# Patient Record
Sex: Female | Born: 1990 | Race: Black or African American | Hispanic: No | Marital: Married | Smoking: Former smoker
Health system: Southern US, Community
[De-identification: ages and names within clinical notes are randomized; demographics above are authoritative.]

## PROBLEM LIST (undated history)

## (undated) DIAGNOSIS — D649 Anemia, unspecified: Secondary | ICD-10-CM

## (undated) DIAGNOSIS — E282 Polycystic ovarian syndrome: Secondary | ICD-10-CM

## (undated) DIAGNOSIS — R7303 Prediabetes: Secondary | ICD-10-CM

---

## 2011-10-06 ENCOUNTER — Encounter: Payer: Self-pay | Admitting: *Deleted

## 2011-10-06 ENCOUNTER — Emergency Department (INDEPENDENT_AMBULATORY_CARE_PROVIDER_SITE_OTHER)
Admission: EM | Admit: 2011-10-06 | Discharge: 2011-10-06 | Disposition: A | Discharge status: Home / Self Care | Payer: Self-pay | Source: Home / Self Care | Attending: Emergency Medicine | Admitting: Emergency Medicine

## 2011-10-06 DIAGNOSIS — R6889 Other general symptoms and signs: Secondary | ICD-10-CM

## 2011-10-06 HISTORY — DX: Anemia, unspecified: D64.9

## 2011-10-06 MED ORDER — ALBUTEROL SULFATE HFA 108 (90 BASE) MCG/ACT IN AERS: 1.0000 | INHALATION_SPRAY | Freq: Four times a day (QID) | RESPIRATORY_TRACT | Status: DC | PRN

## 2011-10-06 MED ORDER — GUAIFENESIN-CODEINE 100-10 MG/5ML PO SYRP: 5.0000 mL | ORAL_SOLUTION | Freq: Four times a day (QID) | ORAL | Status: AC | PRN

## 2011-10-06 MED ORDER — IBUPROFEN 600 MG PO TABS: 600.0000 mg | ORAL_TABLET | Freq: Four times a day (QID) | ORAL | Status: AC | PRN

## 2011-10-06 NOTE — ED Provider Notes (Signed)
History     CSN: [id number] Arrival date & time: 10/06/2011  9:09 AM   First MD Initiated Contact with Patient 10/06/11 0957      Chief Complaint  Patient presents with  . Cough    HPI Comments: HPI : Flu symptoms for about 2 days. Fever to 101 with chills, sweats, myalgias, fatigue, headache, nonproductive cough, rhinorrhea, irritated throat. Symptoms are progressively worsening, despite trying OTC fever reducing medicine and rest and fluids. Has decreased appetite, but tolerating some liquids by mouth. No flu shot this year. 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   Review of Systems: Positive for fatigue, mild nasal congestion, mild sore throat, mild swollen anterior neck glands, mild cough. Negative for acute vision changes, stiff neck, focal weakness, syncope, seizures, respiratory distress, vomiting, diarrhea, GU symptoms.   Patient is a 20 y.o. female presenting with cough. The history is provided by the patient.  Cough The cough is non-productive. The maximum temperature recorded prior to her arrival was 101 to 101.9 F. She is not a smoker. Her past medical history is significant for asthma.    Past Medical History  Diagnosis Date  . Asthma   . Anemia     History reviewed. No pertinent past surgical history.  Family History  Problem Relation Age of Onset  . Hypertension Mother     History  Substance Use Topics  . Smoking status: Never Smoker   . Smokeless tobacco: Not on file  . Alcohol Use: No    OB History    Grav Para Term Preterm Abortions TAB SAB Ect Mult Living                  Review of Systems  Allergies  Review of patient's allergies indicates no known allergies.  Home Medications   Current Outpatient Rx  Name Route Sig Dispense Refill  . ALBUTEROL SULFATE HFA 108 (90 BASE) MCG/ACT IN AERS Inhalation Inhale 1-2 puffs into the lungs every 6 (six) hours as needed for wheezing. 1 Inhaler 0  . GUAIFENESIN-CODEINE 100-10 MG/5ML PO  SYRP Oral Take 5 mLs by mouth 4 (four) times daily as needed for cough. 120 mL 0  . IBUPROFEN 600 MG PO TABS Oral Take 1 tablet (600 mg total) by mouth every 6 (six) hours as needed for pain. 30 tablet 0    BP 114/68  Pulse 92  Temp(Src) 99.9 F (37.7 C) (Oral)  Resp 20  SpO2 100%  LMP 10/05/2011  Physical Exam  Nursing note and vitals reviewed. Constitutional: She is oriented to person, place, and time. She appears well-developed and well-nourished.       Appears uncomfortable  HENT:  Head: Normocephalic and atraumatic.  Right Ear: Tympanic membrane normal.  Left Ear: Tympanic membrane normal.  Nose: Mucosal edema and rhinorrhea present. Right sinus exhibits no maxillary sinus tenderness and no frontal sinus tenderness. Left sinus exhibits no maxillary sinus tenderness and no frontal sinus tenderness.  Mouth/Throat: Uvula is midline and mucous membranes are normal. Posterior oropharyngeal erythema present. No oropharyngeal exudate.       (-) frontal, maxillary sinus tenderness  Eyes: Conjunctivae and EOM are normal. Pupils are equal, round, and reactive to light.  Neck: Normal range of motion. Neck supple.  Cardiovascular: Normal rate, regular rhythm, normal heart sounds and intact distal pulses.   Pulmonary/Chest: Effort normal and breath sounds normal. No respiratory distress. She has no wheezes. She has no rales.  Abdominal: Soft. She exhibits no distension. There  is no tenderness. There is no rebound and no guarding.  Musculoskeletal: Normal range of motion.  Lymphadenopathy:    She has cervical adenopathy.  Neurological: She is alert and oriented to person, place, and time.  Skin: Skin is warm and dry. No rash noted.  Psychiatric: She has a normal mood and affect. Her behavior is normal. Judgment and thought content normal.    ED Course  Procedures (including critical care time)  Labs Reviewed - No data to display No results found.   1. Flu-like symptoms     MDM    Pt appears to be in NAD. VSS. Pt non-toxic appearing. No evidence of pharyngitis or OM. No evidence of neck stiffness or other sx to support meningitis. No evidence of dehydration. Abd S/NT/ND without peritoneal sx. Doubt intraabdominal process. No evidence of PNA or UTI. Pt able to tolerate PO. Pt with viral syndrome, probable flu. Will start bronchodilators as pt with h/o asthma and is at higher risk for PNA. Will treat symptomatically and have pt f/u with PCP of choice PRN   Luiz Blare, MD 10/06/11 1101

## 2011-10-06 NOTE — ED Notes (Signed)
Pt  Has  Symptoms  Of  Cough  /  Congestion        Runny     Nose      Body  Aches     Nasal  stuffyness   Symptoms  X  2  Days    Appears  In no  Severe  Distress   Speaking in  Complete  sentances

## 2012-03-16 ENCOUNTER — Emergency Department (HOSPITAL_COMMUNITY)
Admission: EM | Admit: 2012-03-16 | Discharge: 2012-03-16 | Disposition: A | Discharge status: Home / Self Care | Payer: Self-pay | Attending: Emergency Medicine | Admitting: Emergency Medicine

## 2012-03-16 ENCOUNTER — Encounter (HOSPITAL_COMMUNITY): Payer: Self-pay | Admitting: Emergency Medicine

## 2012-03-16 DIAGNOSIS — R42 Dizziness and giddiness: Secondary | ICD-10-CM | POA: Insufficient documentation

## 2012-03-16 DIAGNOSIS — R0602 Shortness of breath: Secondary | ICD-10-CM | POA: Insufficient documentation

## 2012-03-16 DIAGNOSIS — R109 Unspecified abdominal pain: Secondary | ICD-10-CM | POA: Insufficient documentation

## 2012-03-16 DIAGNOSIS — R112 Nausea with vomiting, unspecified: Secondary | ICD-10-CM | POA: Insufficient documentation

## 2012-03-16 DIAGNOSIS — K439 Ventral hernia without obstruction or gangrene: Secondary | ICD-10-CM | POA: Insufficient documentation

## 2012-03-16 DIAGNOSIS — J45909 Unspecified asthma, uncomplicated: Secondary | ICD-10-CM | POA: Insufficient documentation

## 2012-03-16 MED ORDER — SODIUM CHLORIDE 0.9 % IV BOLUS (SEPSIS)
1000.0000 mL | Freq: Once | INTRAVENOUS | Status: AC
  Administered 2012-03-16: 1000 mL via INTRAVENOUS

## 2012-03-16 MED ORDER — ONDANSETRON HCL 4 MG PO TABS: 4.0000 mg | ORAL_TABLET | Freq: Four times a day (QID) | ORAL | Status: AC

## 2012-03-16 MED ORDER — ONDANSETRON HCL 4 MG/2ML IJ SOLN
4.0000 mg | Freq: Once | INTRAMUSCULAR | Status: AC
  Administered 2012-03-16: 4 mg via INTRAVENOUS
  Filled 2012-03-16: qty 2

## 2012-03-16 NOTE — Discharge Instructions (Signed)
B.R.A.T. Diet Your doctor has recommended the B.R.A.T. diet for you or your child until the condition improves. This is often used to help control diarrhea and vomiting symptoms. If you or your child can tolerate clear liquids, you may have:  Bananas.   Rice.   Applesauce.   Toast (and other simple starches such as crackers, potatoes, noodles).  Be sure to avoid dairy products, meats, and fatty foods until symptoms are better. Fruit juices such as apple, grape, and prune juice can make diarrhea worse. Avoid these. Continue this diet for 2 days or as instructed by your caregiver. Document Released: 10/04/2005 Document Revised: 09/23/2011 Document Reviewed: 03/23/2007 ExitCare Patient Information 2012 [employer]. 

## 2012-03-16 NOTE — ED Provider Notes (Signed)
History     CSN: [id number]  Arrival date & time 03/16/12  0354   First MD Initiated Contact with Patient 03/16/12 0411      Chief Complaint  Patient presents with  . Abdominal Pain  . Nausea    (Consider location/radiation/quality/duration/timing/severity/associated sxs/prior treatment) HPI Comments: Patient was awakened suddenly.  Several hours ago with nausea and vomiting.  No diarrhea.  She states this has aggravated her ventral hernia that is now sore  .  She states she has not vomited in about one hour, but is concerned about her abdominal pain  Patient is a 21 y.o. female presenting with abdominal pain. The history is provided by the patient.  Abdominal Pain The primary symptoms of the illness include abdominal pain, shortness of breath, nausea and vomiting. The primary symptoms of the illness do not include fever, fatigue, diarrhea, dysuria or vaginal discharge. The onset of the illness was sudden.  The abdominal pain began 1 to 2 hours ago. The pain came on gradually. The abdominal pain has been unchanged since its onset. The abdominal pain does not radiate. The severity of the abdominal pain is 2/10. The abdominal pain is exacerbated by vomiting.  Symptoms associated with the illness do not include constipation.    Past Medical History  Diagnosis Date  . Asthma   . Anemia     History reviewed. No pertinent past surgical history.  Family History  Problem Relation Age of Onset  . Hypertension Mother     History  Substance Use Topics  . Smoking status: Never Smoker   . Smokeless tobacco: Not on file  . Alcohol Use: No    OB History    Grav Para Term Preterm Abortions TAB SAB Ect Mult Living                  Review of Systems  Constitutional: Negative for fever and fatigue.  Respiratory: Positive for shortness of breath.   Gastrointestinal: Positive for nausea, vomiting and abdominal pain. Negative for diarrhea, constipation and abdominal distention.    Genitourinary: Negative for dysuria and vaginal discharge.  Neurological: Positive for dizziness. Negative for weakness and headaches.    Allergies  Sulfa antibiotics  Home Medications   Current Outpatient Rx  Name Route Sig Dispense Refill  . ALBUTEROL SULFATE HFA 108 (90 BASE) MCG/ACT IN AERS Inhalation Inhale 1-2 puffs into the lungs every 6 (six) hours as needed for wheezing. 1 Inhaler 0  . ONDANSETRON HCL 4 MG PO TABS Oral Take 1 tablet (4 mg total) by mouth every 6 (six) hours. 12 tablet 0    BP 109/58  Pulse 67  Temp(Src) 98.4 F (36.9 C) (Oral)  Resp 20  SpO2 100%  LMP 02/28/2012  Physical Exam  Constitutional: She is oriented to person, place, and time. She appears well-developed and well-nourished.  HENT:  Head: Normocephalic.  Eyes: Pupils are equal, round, and reactive to light.  Neck: Normal range of motion.  Cardiovascular: Normal rate.   Pulmonary/Chest: Effort normal.  Abdominal: Soft. She exhibits no distension. A hernia is present. Hernia confirmed positive in the ventral area.    Musculoskeletal: Normal range of motion.  Neurological: She is alert and oriented to person, place, and time.  Skin: Skin is warm.    ED Course  Procedures (including critical care time)  Labs Reviewed - No data to display No results found.   1. Nausea and vomiting       MDM  Arman Filter, NP 03/16/12 [phone]

## 2012-03-16 NOTE — ED Notes (Signed)
Pt states she has a hernia and she woke up tonight with pain  Pt states she has been nauseated and has vomited x 1 and since the pain has been worse

## 2012-03-16 NOTE — ED Provider Notes (Signed)
Medical screening examination/treatment/procedure(s) were performed by non-physician practitioner and as supervising physician I was immediately available for consultation/collaboration.   Lyanne Co, MD 03/16/12 [phone]

## 2014-01-29 ENCOUNTER — Ambulatory Visit: Payer: Self-pay | Admitting: Advanced Practice Midwife

## 2014-04-23 ENCOUNTER — Ambulatory Visit: Payer: Self-pay | Admitting: Internal Medicine

## 2015-11-06 ENCOUNTER — Emergency Department (HOSPITAL_COMMUNITY)
Admission: EM | Admit: 2015-11-06 | Discharge: 2015-11-06 | Discharge status: Against Medical Advice | Payer: BLUE CROSS/BLUE SHIELD | Attending: Emergency Medicine | Admitting: Emergency Medicine

## 2015-11-06 ENCOUNTER — Encounter (HOSPITAL_COMMUNITY): Payer: Self-pay | Admitting: Emergency Medicine

## 2015-11-06 DIAGNOSIS — E119 Type 2 diabetes mellitus without complications: Secondary | ICD-10-CM | POA: Insufficient documentation

## 2015-11-06 DIAGNOSIS — R112 Nausea with vomiting, unspecified: Secondary | ICD-10-CM | POA: Insufficient documentation

## 2015-11-06 DIAGNOSIS — R42 Dizziness and giddiness: Secondary | ICD-10-CM | POA: Diagnosis not present

## 2015-11-06 DIAGNOSIS — J45901 Unspecified asthma with (acute) exacerbation: Secondary | ICD-10-CM | POA: Insufficient documentation

## 2015-11-06 DIAGNOSIS — R05 Cough: Secondary | ICD-10-CM | POA: Diagnosis present

## 2015-11-06 HISTORY — DX: Polycystic ovarian syndrome: E28.2

## 2015-11-06 NOTE — ED Notes (Signed)
Pt reports nausea and vomiting after taking Metformin that was just prescribed to her four days ago.  Pt also reports chest pain that has started on Wednesday with cough, lightheadedness, nausea, and vomiting.

## 2015-11-16 ENCOUNTER — Emergency Department (HOSPITAL_COMMUNITY)
Admission: EM | Admit: 2015-11-16 | Discharge: 2015-11-16 | Disposition: A | Discharge status: Home / Self Care | Payer: BLUE CROSS/BLUE SHIELD | Attending: Emergency Medicine | Admitting: Emergency Medicine

## 2015-11-16 ENCOUNTER — Emergency Department (HOSPITAL_COMMUNITY): Payer: BLUE CROSS/BLUE SHIELD

## 2015-11-16 ENCOUNTER — Encounter (HOSPITAL_COMMUNITY): Payer: Self-pay | Admitting: *Deleted

## 2015-11-16 DIAGNOSIS — Z87891 Personal history of nicotine dependence: Secondary | ICD-10-CM | POA: Diagnosis not present

## 2015-11-16 DIAGNOSIS — J45901 Unspecified asthma with (acute) exacerbation: Secondary | ICD-10-CM | POA: Insufficient documentation

## 2015-11-16 DIAGNOSIS — Z8639 Personal history of other endocrine, nutritional and metabolic disease: Secondary | ICD-10-CM | POA: Diagnosis not present

## 2015-11-16 DIAGNOSIS — R0789 Other chest pain: Secondary | ICD-10-CM | POA: Insufficient documentation

## 2015-11-16 DIAGNOSIS — Z862 Personal history of diseases of the blood and blood-forming organs and certain disorders involving the immune mechanism: Secondary | ICD-10-CM | POA: Insufficient documentation

## 2015-11-16 DIAGNOSIS — R05 Cough: Secondary | ICD-10-CM | POA: Diagnosis present

## 2015-11-16 HISTORY — DX: Prediabetes: R73.03

## 2015-11-16 MED ORDER — ALBUTEROL SULFATE HFA 108 (90 BASE) MCG/ACT IN AERS: 1.0000 | INHALATION_SPRAY | Freq: Four times a day (QID) | RESPIRATORY_TRACT | Status: AC | PRN

## 2015-11-16 MED ORDER — IPRATROPIUM-ALBUTEROL 0.5-2.5 (3) MG/3ML IN SOLN
3.0000 mL | Freq: Once | RESPIRATORY_TRACT | Status: AC
  Administered 2015-11-16: 3 mL via RESPIRATORY_TRACT
  Filled 2015-11-16: qty 3

## 2015-11-16 NOTE — ED Notes (Signed)
Patient c/o dry cough x2 and SOB with inspiration weeks that has not improved.  Patient denies current or previous sore throat, fever, otalgia or nasal congestion.  Patient LWBS on 1/19 after coming in to be seen because of N/V due to Metformin.  Patient states she is taking Metformin due to PCOS, but the N/V has resolved.  Patient's CP is reproducible with palpation, worse R side.  Patient's lung sounds CTAB.  Patient's WOB is not increased and she is in NAD.  Patient's family member is also coughing in triage.

## 2015-11-16 NOTE — ED Provider Notes (Signed)
CSN: [id number]     Arrival date & time 11/16/15  1547 History  By signing my name below, I, Michelle Patterson, attest that this documentation has been prepared under the direction and in the presence of [facility], Patterson . Electronically Signed: Angelene Patterson, ED Patterson. 11/16/2015. 4:56 PM.    Chief Complaint  Patient presents with  . Cough   The history is provided by the patient. No language interpreter was used.   HPI Comments: Michelle Patterson is a 25 y.o. female with a hx of childhood asthma who presents to the Emergency Department complaining of gradually worsening persistent intermittent moderate non-productive cough onset 2 days ago. She reports associated dull/achy chest wall pain, and difficulty breathing. She denies any fever, nasal congestion, or abdominal pain. No hospitalizations for asthma, no recent steroid use. Pt. denies smoking, but states she quit at the beginning of January and has still been in smoky environments over the last few weeks.    Past Medical History  Diagnosis Date  . Asthma   . Anemia   . PCOS (polycystic ovarian syndrome)   . Pre-diabetes    History reviewed. No pertinent past surgical history. Family History  Problem Relation Age of Onset  . Hypertension Mother    Social History  Substance Use Topics  . Smoking status: Former Smoker    Quit date: 10/23/2015  . Smokeless tobacco: Never Used  . Alcohol Use: Yes     Comment: occassionally   OB History    No data available     Review of Systems  Constitutional: Negative for fever and chills.  HENT: Negative for congestion.   Eyes: Negative for visual disturbance.  Respiratory: Positive for cough, chest tightness and shortness of breath.        Chest wall pain  Gastrointestinal: Negative for abdominal pain.  Genitourinary: Negative for dysuria.  Musculoskeletal: Negative for back pain and neck pain.  Skin: Negative for rash.  Allergic/Immunologic: Negative for immunocompromised  state.  Neurological: Negative for headaches.      Allergies  Sulfa antibiotics  Home Medications   Prior to Admission medications   Medication Sig Start Date End Date Taking? Authorizing Provider  albuterol (PROVENTIL HFA;VENTOLIN HFA) 108 (90 Base) MCG/ACT inhaler Inhale 1-2 puffs into the lungs every 6 (six) hours as needed for wheezing or shortness of breath. 11/16/15   Michelle Patterson   BP 122/60 mmHg  Pulse 66  Temp(Src) 98.1 F (36.7 C) (Oral)  Resp 16  SpO2 100%  LMP 11/10/2015 Physical Exam  Constitutional: She is oriented to person, place, and time. She appears well-developed and well-nourished.  HENT:  Head: Normocephalic and atraumatic.  Mouth/Throat: Oropharynx is clear and moist. No oropharyngeal exudate.  Cardiovascular: Normal rate, regular rhythm and normal heart sounds.   Pulmonary/Chest: Effort normal and breath sounds normal. No respiratory distress.  TTP of right chest wall - no overlying skin changes including erythema, ecchymosis.   Abdominal: Soft. She exhibits no distension. There is no tenderness.  Musculoskeletal: Normal range of motion. She exhibits no edema.  Neurological: She is alert and oriented to person, place, and time.  Skin: Skin is warm and dry.  Psychiatric: She has a normal mood and affect.  Nursing note and vitals reviewed.   ED Course  Procedures (including critical care time) DIAGNOSTIC STUDIES: Oxygen Saturation is 98% on RA, normal by my interpretation.    COORDINATION OF CARE: 4:50 PM- Pt advised of plan for treatment and pt agrees. Pt informed  of x-ray results. Pt will receive a breathing treatment.    Imaging Review Dg Chest 2 View  11/16/2015  CLINICAL DATA:  Worsening nonproductive cough and shortness of breath for 2 weeks. EXAM: CHEST  2 VIEW COMPARISON:  None. FINDINGS: The heart size and mediastinal contours are within normal limits. Both lungs are clear. The visualized skeletal structures are unremarkable.  IMPRESSION: Normal exam. Electronically Signed   By: Kennith Center M.D.   On: 11/16/2015 16:30     Michelle Niemann Lavance Beazer, Patterson has personally reviewed and evaluated these images as part of her medical decision-making.  MDM   Final diagnoses:  Chest tightness   Michelle Patterson presents with chest tightness and shortness of breath. Hx of childhood asthma, but no exacerbations in approx. 5 years. Patient not wheezing on exam. Requesting breathing treatment, i will oblige - Duoneb x 1 given. Patient feels much improved after treatment. Will dc to home in good and stable condition with home albuterol rescue inhaler. PCP follow up strongly encouraged and resources given. Pt. With [employer] - told to call number on card to aid in finding PCP. Return precautions and home care instructions given.   I personally performed the services described in this documentation, which was scribed in my presence. The recorded information has been reviewed and is accurate.  [facility] Michelle Patterson 11/16/15 1827  Michelle Patterson 11/18/15 [phone]

## 2015-11-16 NOTE — Discharge Instructions (Signed)
Use inhaler as needed for shortness of breath.  Follow up with a primary physician for further evaluation and management of your symptoms. Attached is the resource guide we discussed for help finding a primary care provider. You can also call the number on your insurance card and they can put you in touch with providers in your area that accept your insurance.    Emergency Department Resource Guide 1) Find a Doctor and Pay Out of Pocket Although you won't have to find out who is covered by your insurance plan, it is a good idea to ask around and get recommendations. You will then need to call the office and see if the doctor you have chosen will accept you as a new patient and what types of options they offer for patients who are self-pay. Some doctors offer discounts or will set up payment plans for their patients who do not have insurance, but you will need to ask so you aren't surprised when you get to your appointment.  2) Contact Your Local Health Department Not all health departments have doctors that can see patients for sick visits, but many do, so it is worth a call to see if yours does. If you don't know where your local health department is, you can check in your phone book. The CDC also has a tool to help you locate your state's health department, and many state websites also have listings of all of their local health departments.  3) Find a Walk-in Clinic If your illness is not likely to be very severe or complicated, you may want to try a walk in clinic. These are popping up all over the country in pharmacies, drugstores, and shopping centers. They're usually staffed by nurse practitioners or physician assistants that have been trained to treat common illnesses and complaints. They're usually fairly quick and inexpensive. However, if you have serious medical issues or chronic medical problems, these are probably not your best option.  No Primary Care Doctor: - Call Health Connect at   [phone] - they can help you locate a primary care doctor that  accepts your insurance, provides certain services, etc. - Physician Referral Service- [phone]  Chronic Pain Problems: Organization         Address  Phone   Notes  [facility]  [phone] Patients need to be referred by their primary care doctor.   Medication Assistance: Organization         Address  Phone   Notes  [facility] Medication [facility] [address] [place] [place], [place] [address] [phone] --Must be a resident of [facility] -- Must have NO insurance coverage whatsoever (no Medicaid/ Medicare, etc.) -- The pt. MUST have a primary care doctor that directs their care regularly and follows them in the community   [facility]  [phone]   [employer]  [phone]    Agencies that provide inexpensive medical care: Organization         Address  Phone   Notes  [facility]  [phone]   [facility]    [phone]   [facility] [address] [place], [place] [address] [phone]   [facility] [address], [place] [phone]   [facility]    [phone]   [facility]    [phone]   [facility]  [address] [place],  [place] Phone:  [phone], Fax:  [phone] Hours of Operation:  9 am - 6 pm, M-F.  Also accepts Medicaid/Medicare and self-pay.  [facility] for [place], [address], [place] Phone: [phone], Fax: [phone]. Hours of Operation:  8:30 am - 5:30 pm, M-F.  Also accepts Medicaid and self-pay.  [facility] [address], [place] Phone: [phone]   [place], [place], [place] [phone], Ext. [phone] Mondays & Thursdays: 7-9 AM.  First 15 patients are seen on a first come, first serve basis.     [place] Providers:  Organization         Address  Phone   Notes  [facility] [address], [place] [phone] Also accepts self-pay patients.  [facility] [address], [place]  [phone]   [place], [address], [place] [phone]   [facility] [address], [place] [phone]   Lucianne Lei [address], [place]   [phone] Only accepts [place] Access [place] patients after they have their name applied to their card.   Self-Pay (no insurance) in [facility]:  Organization         Address  Phone   Notes  Sickle Cell Patients, [facility] [place] [phone]   [facility] [place] [phone]   [facility] [place]  [place], [place], [place] [phone]   [facility]/Dr. Osei-Bonsu  [address], [place] or [address], [place] [phone] Phone number for both [place] and [place] locations is the same.  [facility] [address] [place] [phone]   [facility] [address], [place] or [address] [phone]   [facility] [address], [place] [phone], phone; [phone], fax Sees patients 1st and 3rd Saturday of every month.  Must not qualify for public or private insurance (i.e. Medicaid, Medicare, [place] Health Choice, Veterans' Benefits)  Household income should be no more than 200% of the poverty level The clinic cannot treat you if you are pregnant or think you are pregnant  Sexually transmitted diseases are not treated at the clinic.    Dental Care: Organization         Address  Phone  Notes  [facility]  [facility] of [facility] [place] [phone] Accepts children up to age 40 who are enrolled in [place] or [place]; pregnant women with a Medicaid card; and children who have applied for Medicaid or [place] Health Choice, but were declined, whose parents can pay a reduced fee at time of service.  [facility] Department of [facility]  [address], [place] [phone] Accepts children up to age 58 who are enrolled in [place] or [place]; pregnant women with a Medicaid card; and children who have applied for Medicaid or [place] Health Choice, but were declined, whose parents can pay a reduced fee at time of service.  [place] [facility]  [place] [phone] Patients are seen by  appointment only. Walk-ins are not accepted. [facility] will see patients 74 years of age and older. Monday - Tuesday (8am-5pm) Most Wednesdays (8:30-5pm) $30 per visit, cash only  [facility] Adult [facility] Access PRO[facility]  [address], [facility] [phone] Patients are seen by appointment only. Walk-ins are not accepted. [facility] will see patients 23 years of age and older. One Wednesday Evening (Monthly: Volunteer Based).  $30 per visit, cash only  [employer] [facility] [employer]  [phone] for adults; Children under age 64, call Graduate Pediatric Dentistry at [phone]. Children aged 94-14, please call [phone] to request a pediatric application.  Dental services are provided in all areas of dental care including fillings, crowns and bridges, complete and partial dentures, implants, gum treatment, root canals, and extractions. Preventive care is also provided. Treatment is provided to both adults and children. Patients are selected via a lottery and there is often a waiting list.   [facility] [address], [place]  [phone]  [email or web address]   [facility] [address] [place], [place] [phone], Ext. [phone] Second and Fourth Thursday of each month, opens at 6:30 AM; Clinic ends at 9 AM.  Patients are seen on a first-come first-served basis, and a limited number are seen during each clinic.   [facility]  [address] [place], [place] [phone]   Eligibility Requirements You must have lived in [place], [place], or [place] counties for at least the last three months.   You cannot be eligible for state or federal sponsored [place], including [employer], [place], or [employer].   You generally cannot be eligible for healthcare insurance through your employer.    How to apply: Eligibility screenings are held every Tuesday and Wednesday afternoon from 1:00 pm until 4:00 pm. You do not need an appointment for the interview!  [facility] [address] [place], [place] [phone]   [facility] Health [facility]  [phone]   [facility] Health Department  [phone]   [facility] Health Department  [phone]

## 2015-11-16 NOTE — ED Notes (Signed)
Pt c/o mid-sternal pain with cough or deep inspiration.  Pt stating "it's been happening the last couple of weeks on & off.  I did have asthma as a child."

## 2016-08-29 IMAGING — CR DG CHEST 2V
2 series · 2 of 2 positions shown · non-contrast
Comparison: None.

CLINICAL DATA: Worsening nonproductive cough and shortness of
breath for 2 weeks.

EXAM:
CHEST  2 VIEW

[w chest pa]
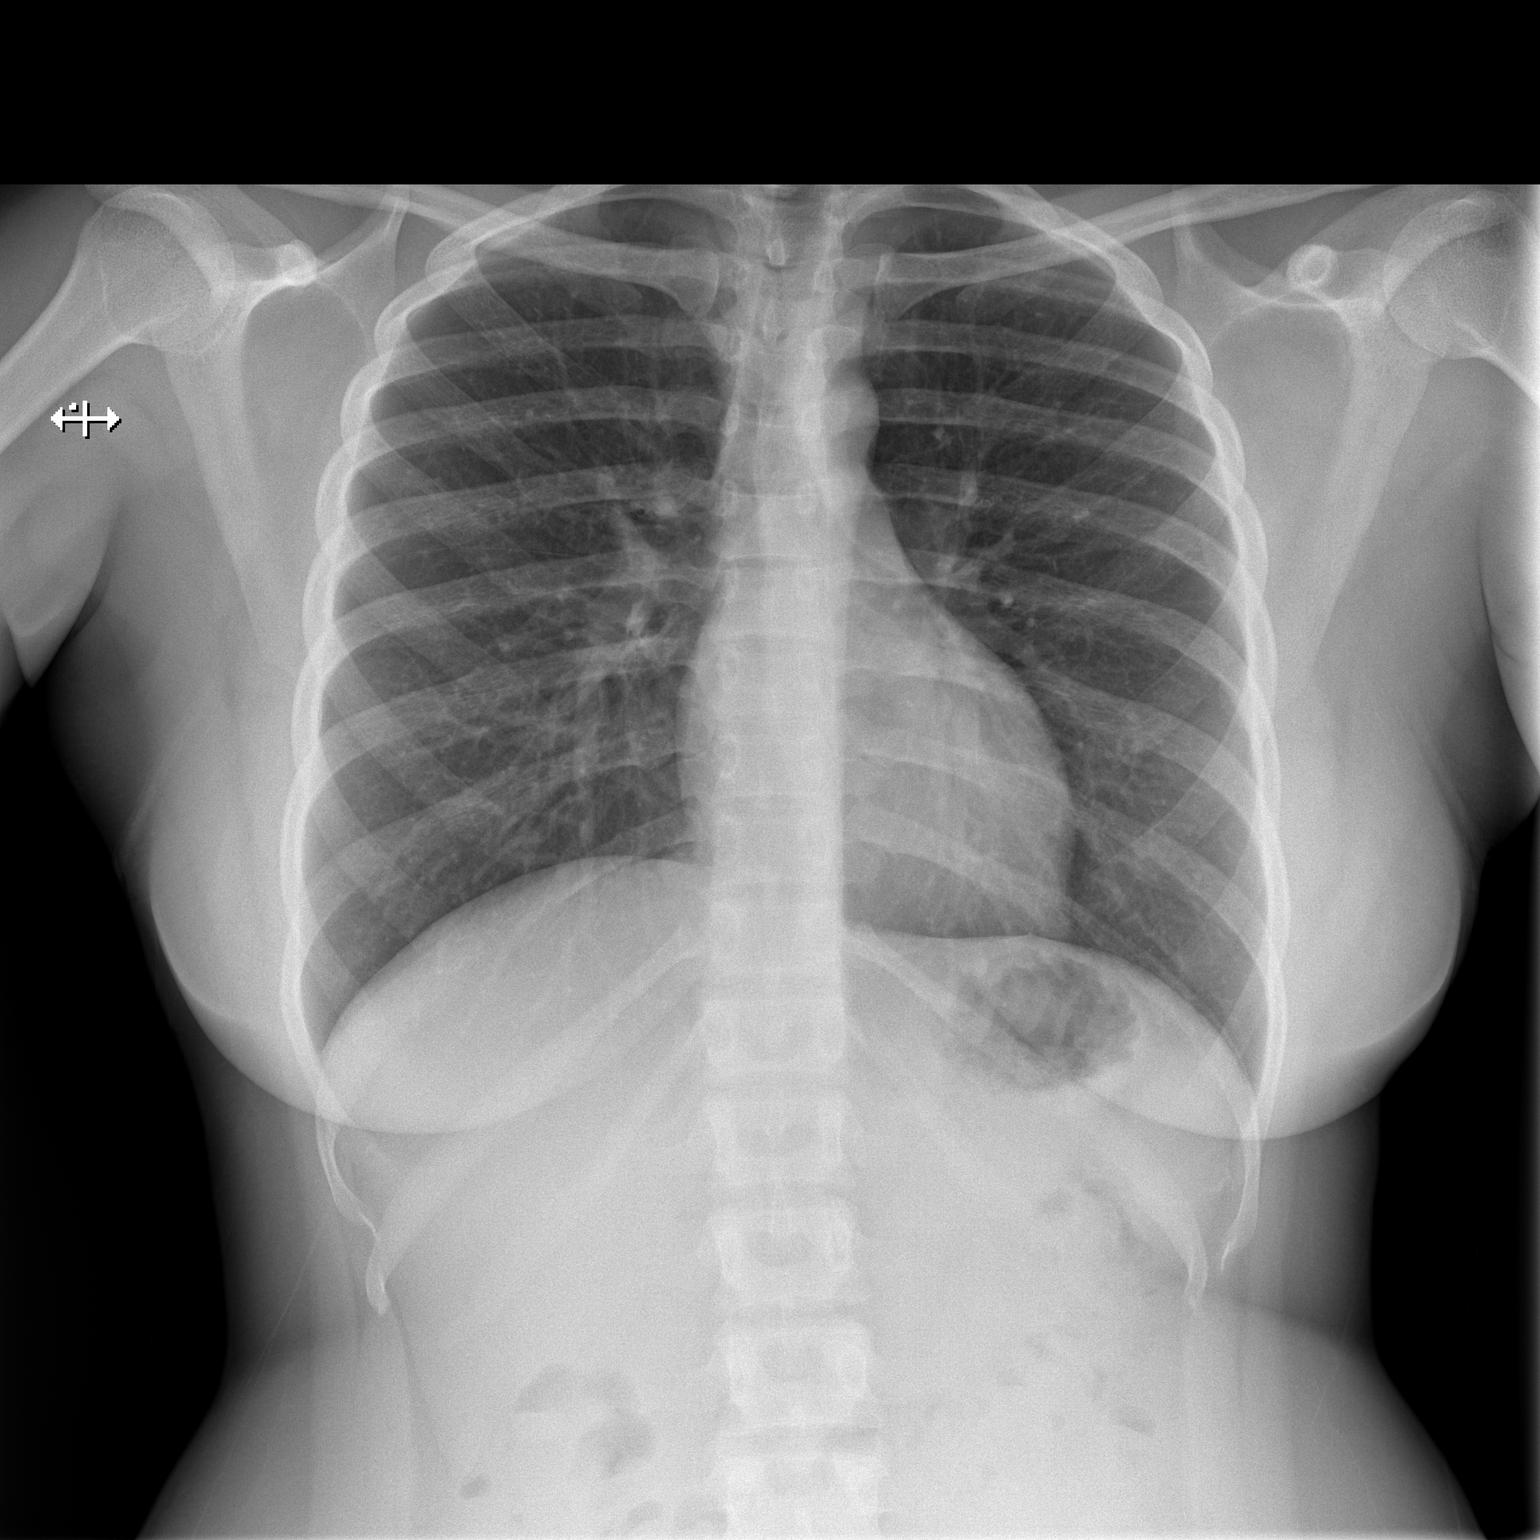

[w chest lat]
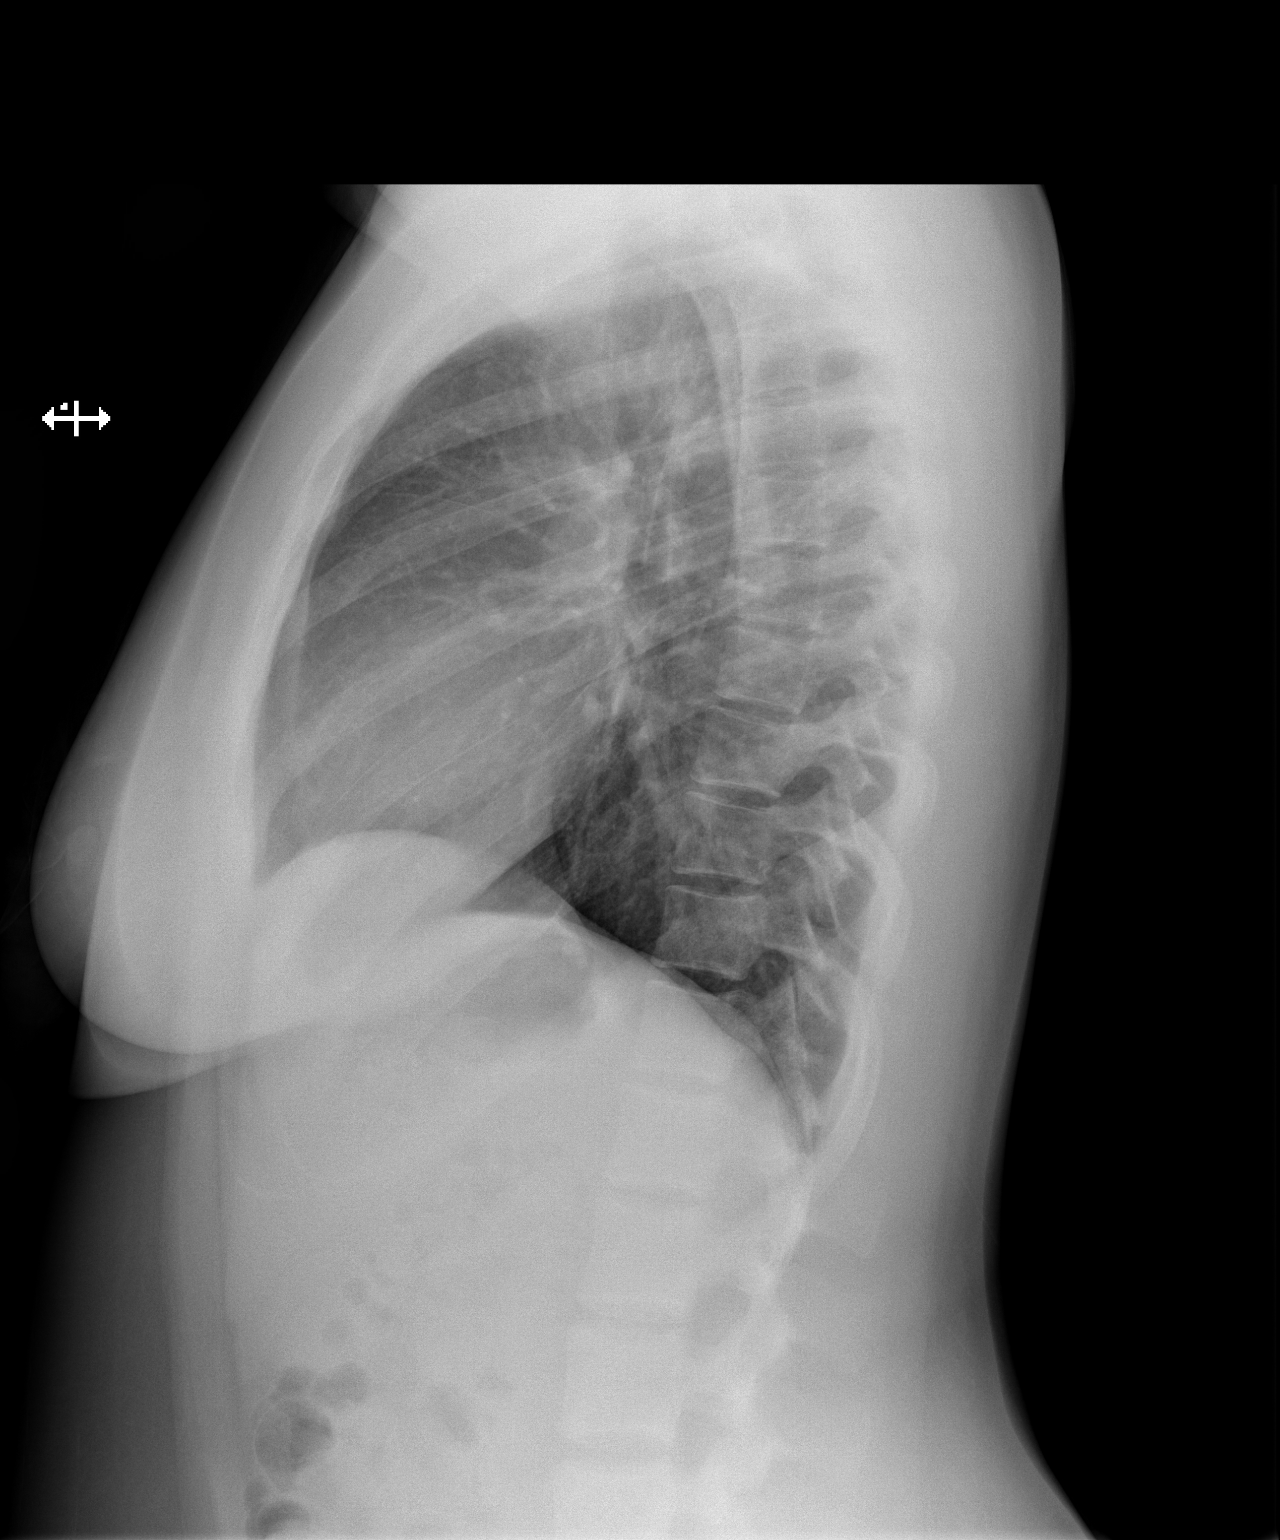

[2 of 2 positions shown; findings below may reference images not displayed]

FINDINGS: The heart size and mediastinal contours are within normal limits.
Both lungs are clear. The visualized skeletal structures are
unremarkable.
IMPRESSION: Normal exam.
